# Patient Record
Sex: Female | Born: 1960 | Race: White | Hispanic: No | Marital: Married | Smoking: Never smoker
Health system: Southern US, Community
[De-identification: ages and names within clinical notes are randomized; demographics above are authoritative.]

## PROBLEM LIST (undated history)

## (undated) DIAGNOSIS — F32A Depression, unspecified: Secondary | ICD-10-CM

## (undated) DIAGNOSIS — Z8759 Personal history of other complications of pregnancy, childbirth and the puerperium: Secondary | ICD-10-CM

## (undated) DIAGNOSIS — E785 Hyperlipidemia, unspecified: Secondary | ICD-10-CM

## (undated) DIAGNOSIS — M1990 Unspecified osteoarthritis, unspecified site: Secondary | ICD-10-CM

## (undated) HISTORY — PX: BILATERAL OOPHORECTOMY: SHX1221

## (undated) HISTORY — DX: Unspecified osteoarthritis, unspecified site: M19.90

## (undated) HISTORY — DX: Hyperlipidemia, unspecified: E78.5

## (undated) HISTORY — PX: CHOLECYSTECTOMY: SHX55

## (undated) HISTORY — DX: Depression, unspecified: F32.A

## (undated) HISTORY — PX: COLONOSCOPY: SHX174

## (undated) HISTORY — DX: Personal history of other complications of pregnancy, childbirth and the puerperium: Z87.59

---

## 1999-06-09 ENCOUNTER — Other Ambulatory Visit: Admission: RE | Admit: 1999-06-09 | Discharge: 1999-06-09 | Payer: Self-pay | Admitting: Gynecology

## 1999-10-09 ENCOUNTER — Encounter: Admission: RE | Admit: 1999-10-09 | Discharge: 1999-10-09 | Payer: Self-pay | Admitting: Gynecology

## 1999-10-09 ENCOUNTER — Encounter: Payer: Self-pay | Admitting: Gynecology

## 2000-12-21 ENCOUNTER — Other Ambulatory Visit: Admission: RE | Admit: 2000-12-21 | Discharge: 2000-12-21 | Payer: Self-pay | Admitting: Gynecology

## 2005-10-06 ENCOUNTER — Encounter: Admission: RE | Admit: 2005-10-06 | Discharge: 2005-10-06 | Payer: Self-pay | Admitting: Gynecology

## 2005-11-03 ENCOUNTER — Inpatient Hospital Stay (HOSPITAL_COMMUNITY): Admission: EM | Admit: 2005-11-03 | Discharge: 2005-11-04 | Payer: Self-pay | Admitting: Emergency Medicine

## 2005-11-03 ENCOUNTER — Encounter (INDEPENDENT_AMBULATORY_CARE_PROVIDER_SITE_OTHER): Payer: Self-pay | Admitting: *Deleted

## 2006-08-10 IMAGING — US US ABDOMEN COMPLETE
1 series · 13 of 25 positions shown · non-contrast
Comparison: None.

CLINICAL DATA: Abdominal pain.  Question gallbladder disease.  History of endometriosis.  
 ABDOMEN ULTRASOUND:
TECHNIQUE: Complete abdominal ultrasound examination was performed including evaluation of the liver, gallbladder, bile ducts, pancreas, kidneys, spleen, IVC, and abdominal aorta.

[Series 1: unknown · 0.33mm/px · 13 of 80 slices shown]
[im 1/80]
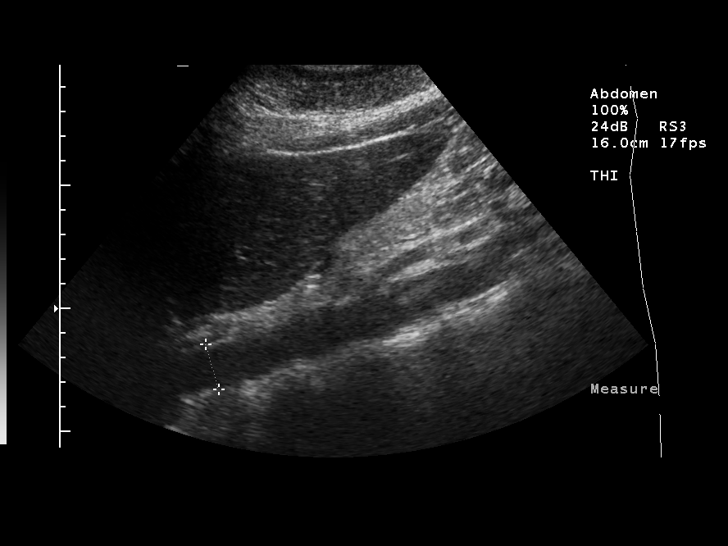
[im 7/80]
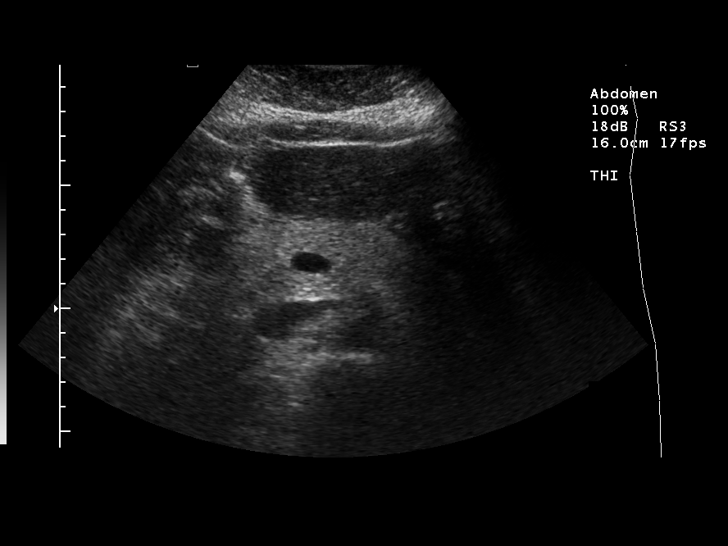
[im 14/80]
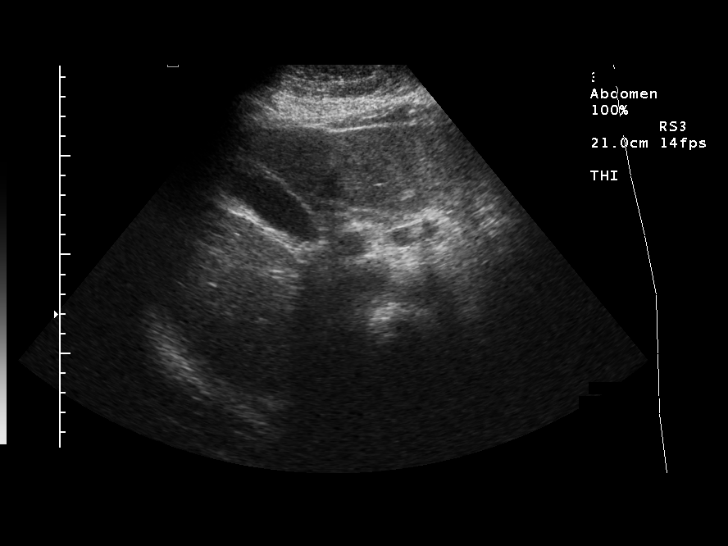
[im 20/80]
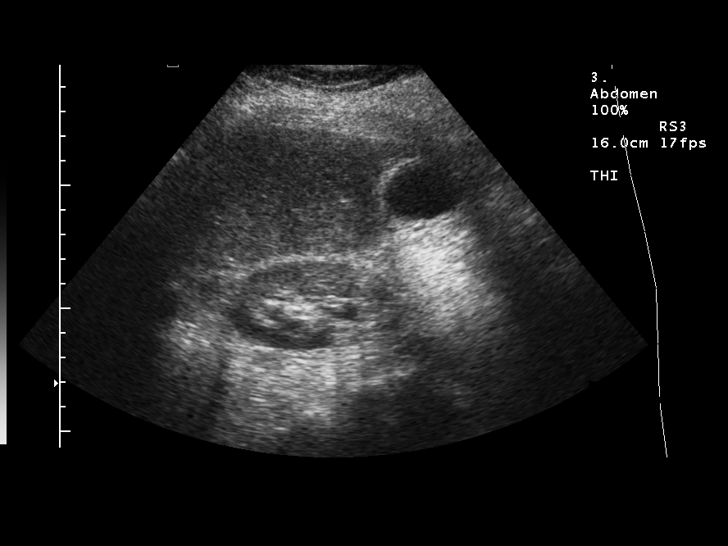
[im 27/80]
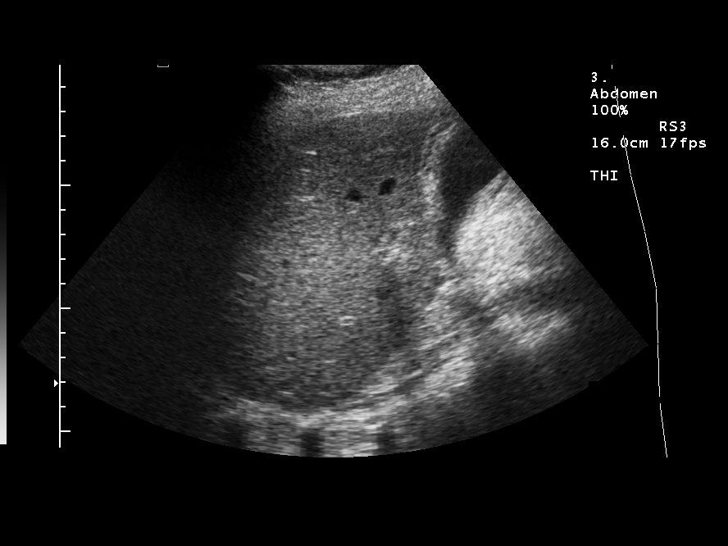
[im 33/80]
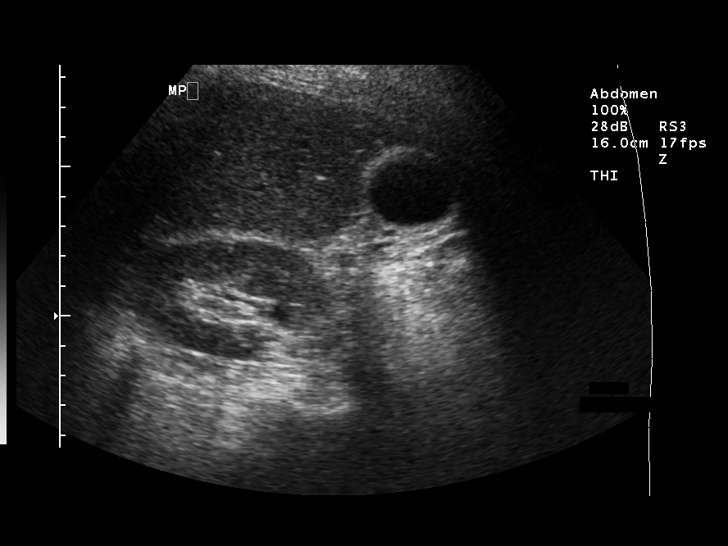
[im 40/80]
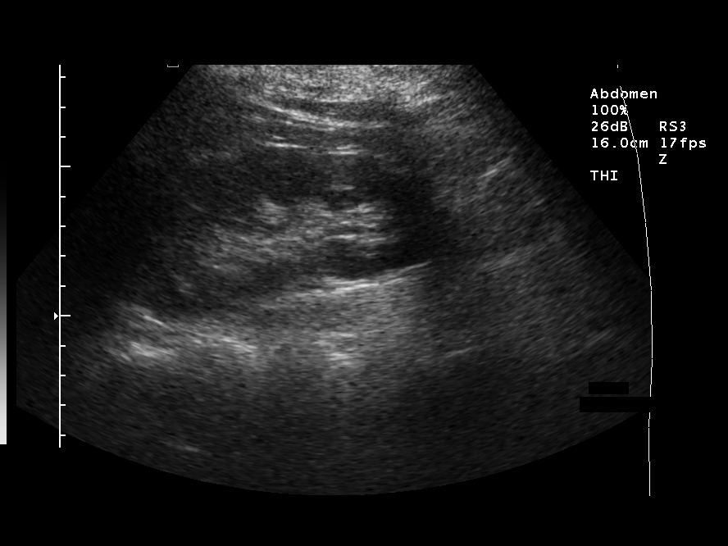
[im 47/80]
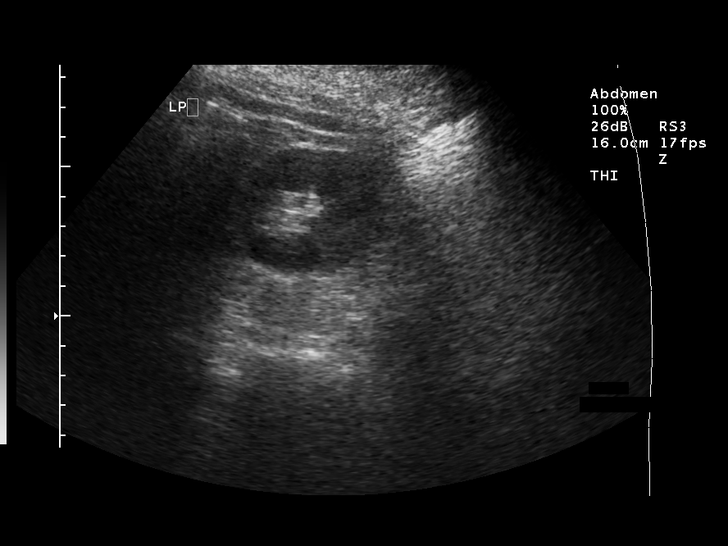
[im 53/80]
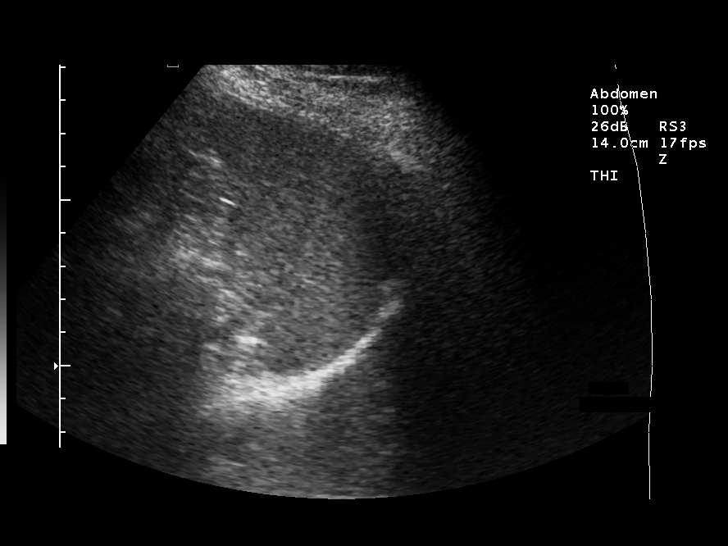
[im 60/80]
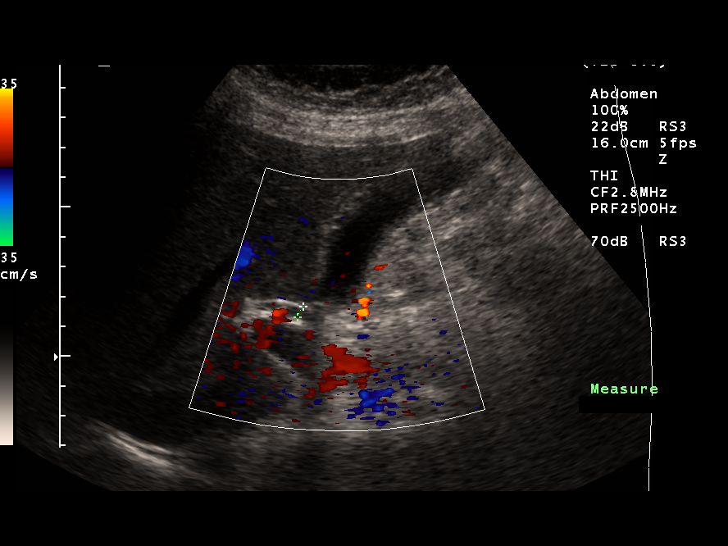
[im 66/80]
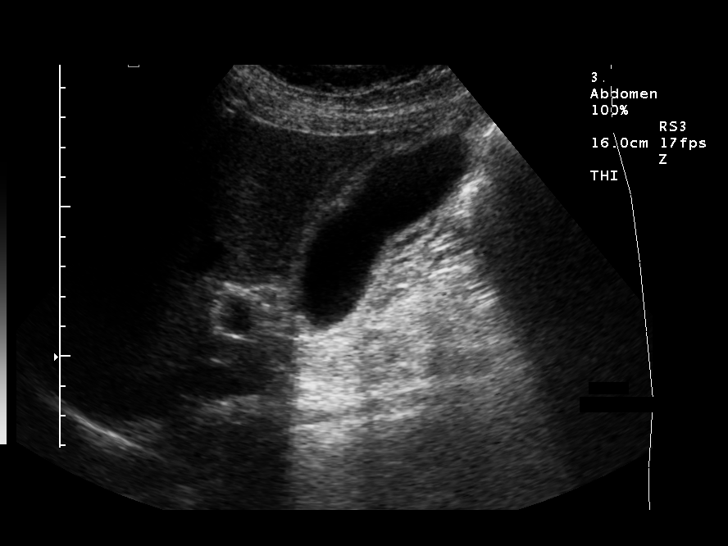
[im 73/80]
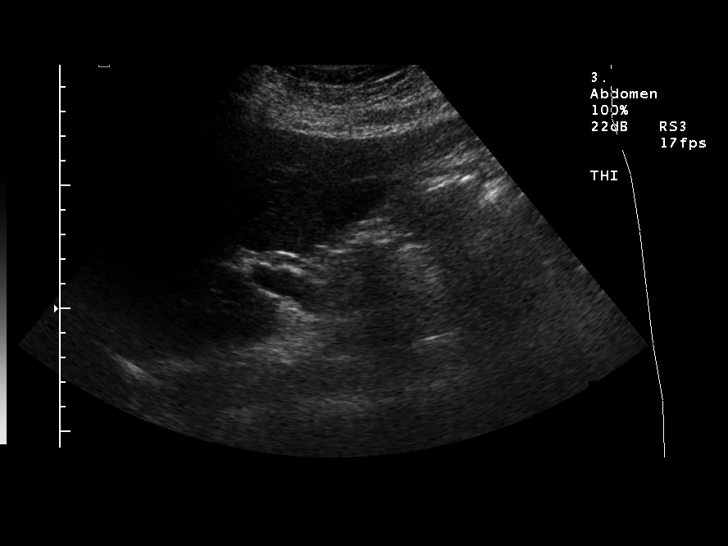
[im 80/80]
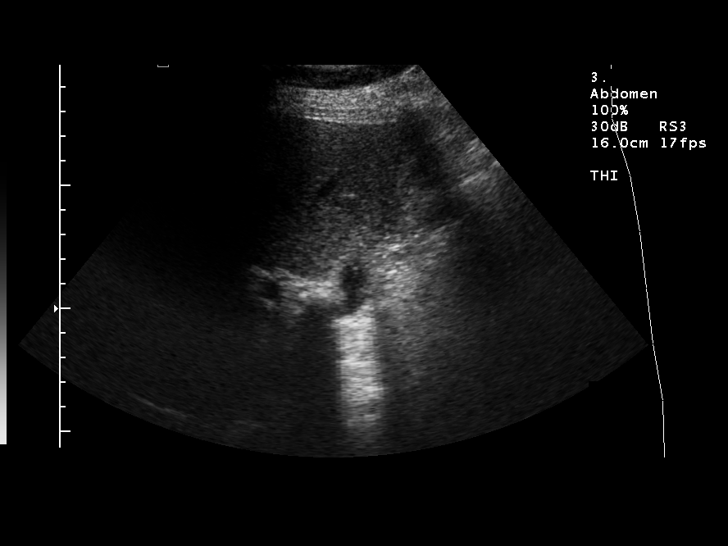

[13 of 25 positions shown; findings below may reference images not displayed]

FINDINGS: There is an echogenic shadowing focus in the gallbladder neck compatible with a compacted stone.  No other gallstones are seen.  There is gallbladder wall thickening to 5 mm.  The patient was not tender on imaging the gallbladder, and no pericholecystic fluid is seen.  However, these findings are suspicious for early cholecystitis. 
 There is no biliary dilatation.  Views of the liver, spleen, inferior vena cava, and abdominal aorta are unremarkable.  Both kidneys appear normal, measuring 11.4 cm in length on the right and 11.5 cm on the left.
IMPRESSION: 1.  Impacted gallstone within the gallbladder neck and gallbladder wall thickening are consistent with early acute cholecystitis.  The patient was not reported to be tender on gallbladder imaging, however.  If further evaluation is warranted clinically, hepatobiliary scan could be performed. 
 2.  Otherwise unremarkable examination.  No biliary dilatation.

## 2006-08-10 IMAGING — CR DG ABDOMEN ACUTE W/ 1V CHEST
3 series · 3 of 3 positions shown · non-contrast
Comparison: none

HISTORY: Epigastric pain, vomiting

ABDOMEN ACUTE WITH PA CHEST:
Normal heart size, mediastinal contours, and pulmonary vascularity.
Lungs clear.
Nonspecific bowel gas pattern.
No signs of bowel obstruction, bowel wall thickening, or perforation.
Bones unremarkable.
No urinary tract calcification.

[w chest pa]
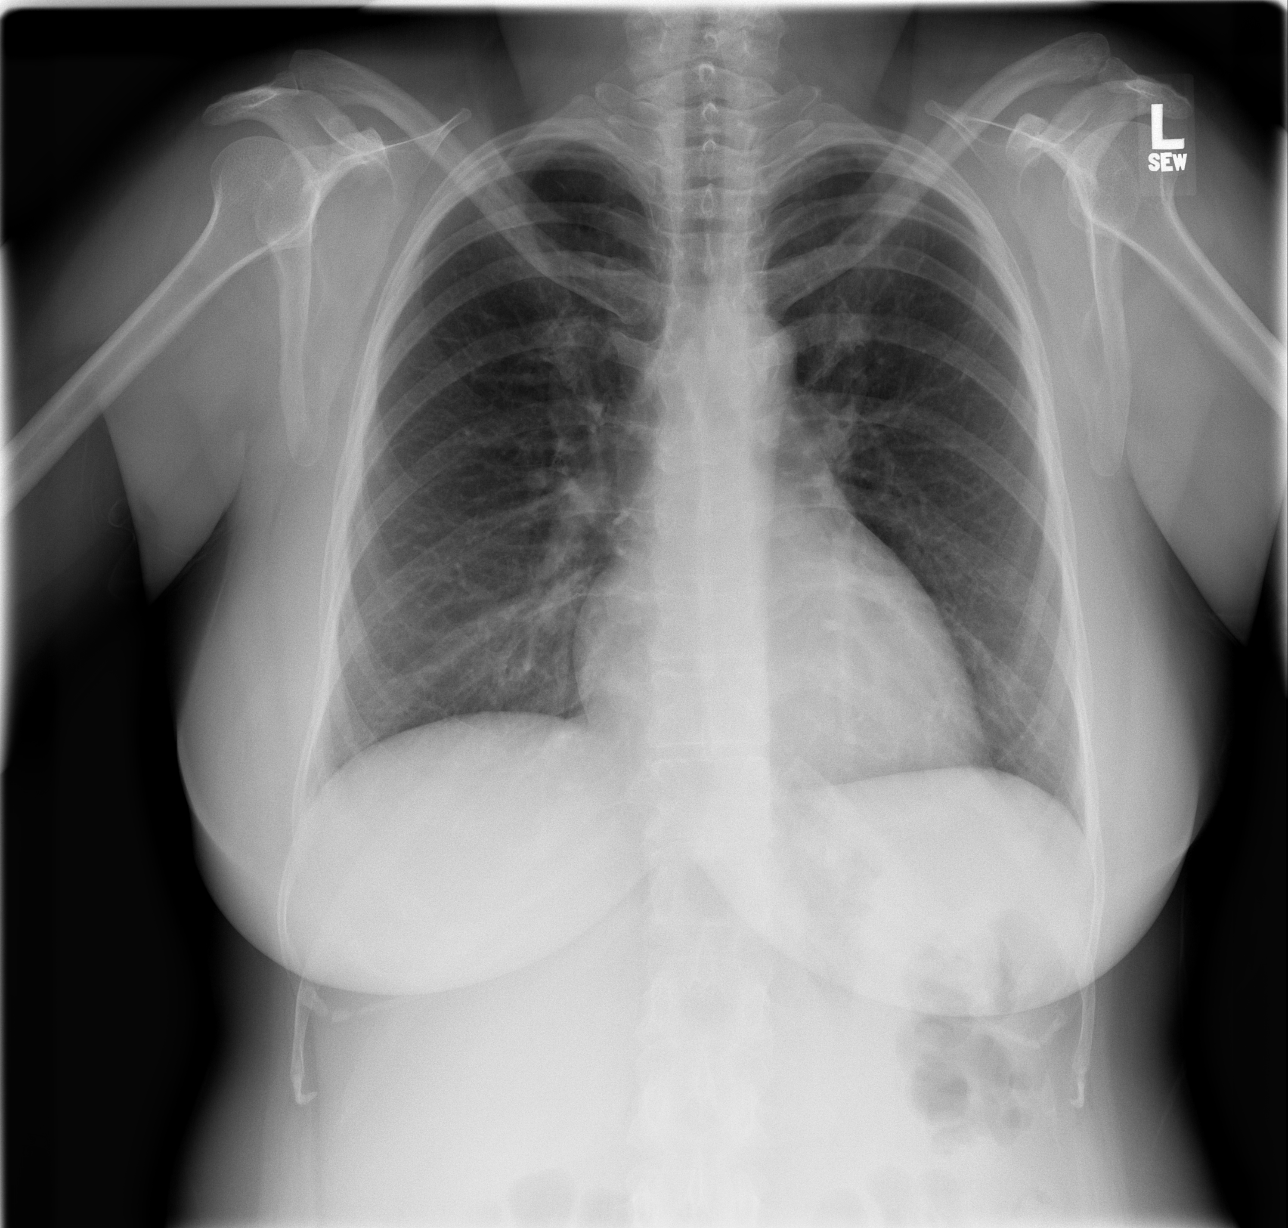

[w abdomen upright]
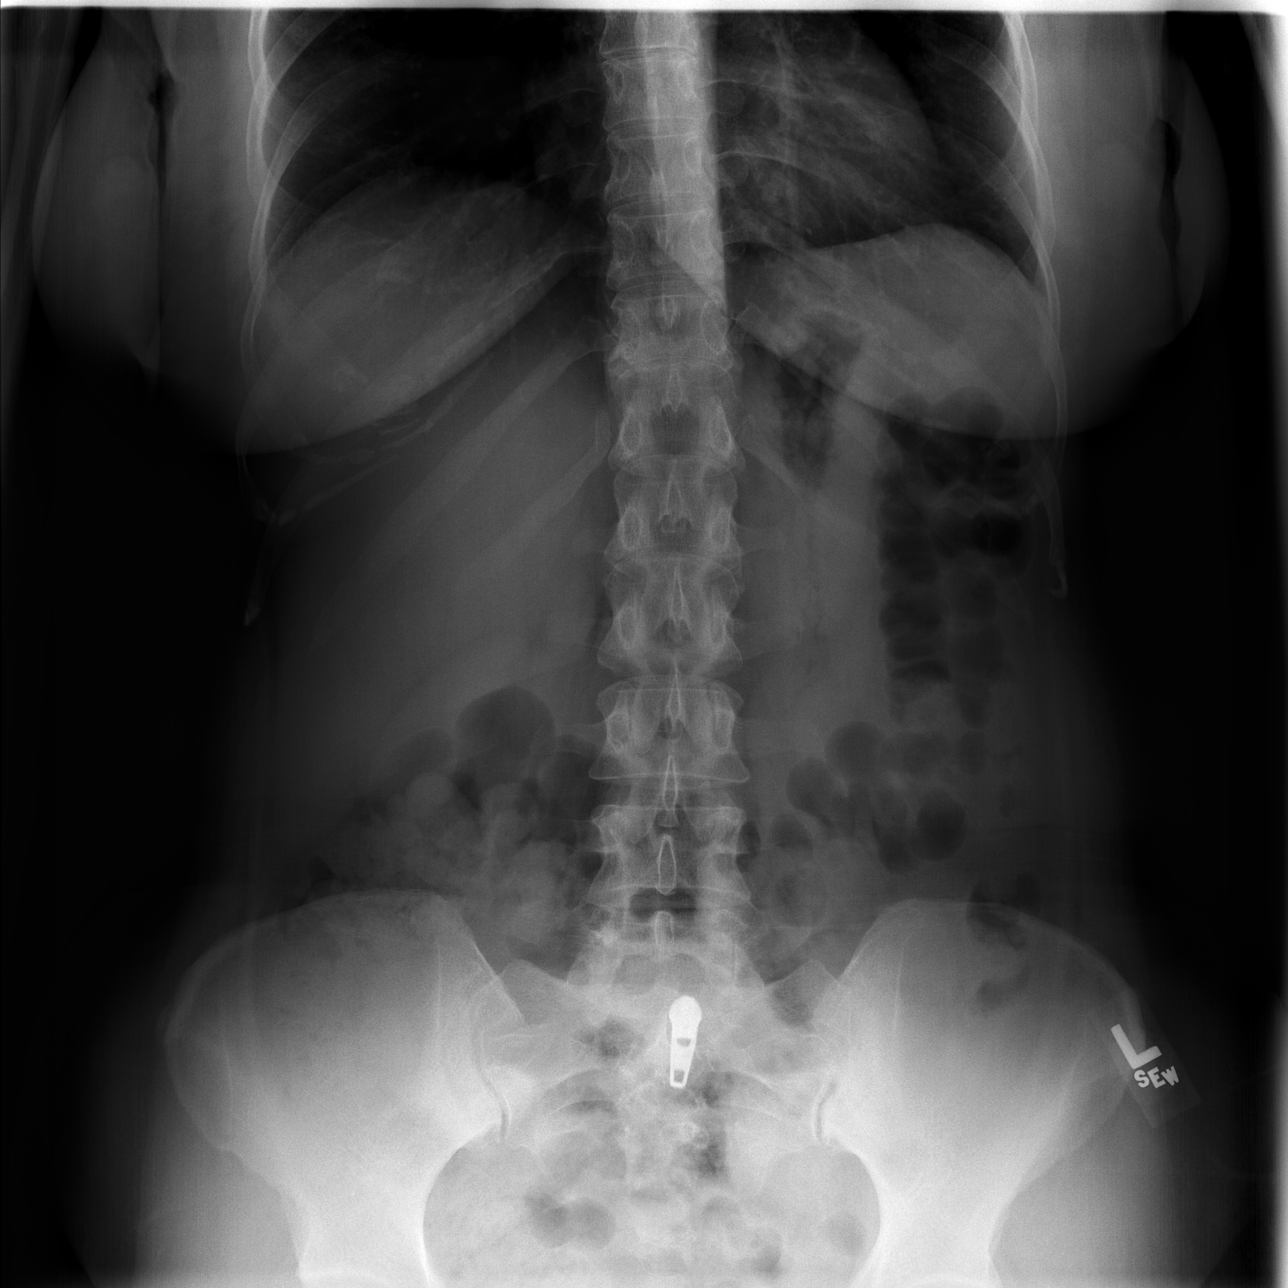

[t abdomen supine]
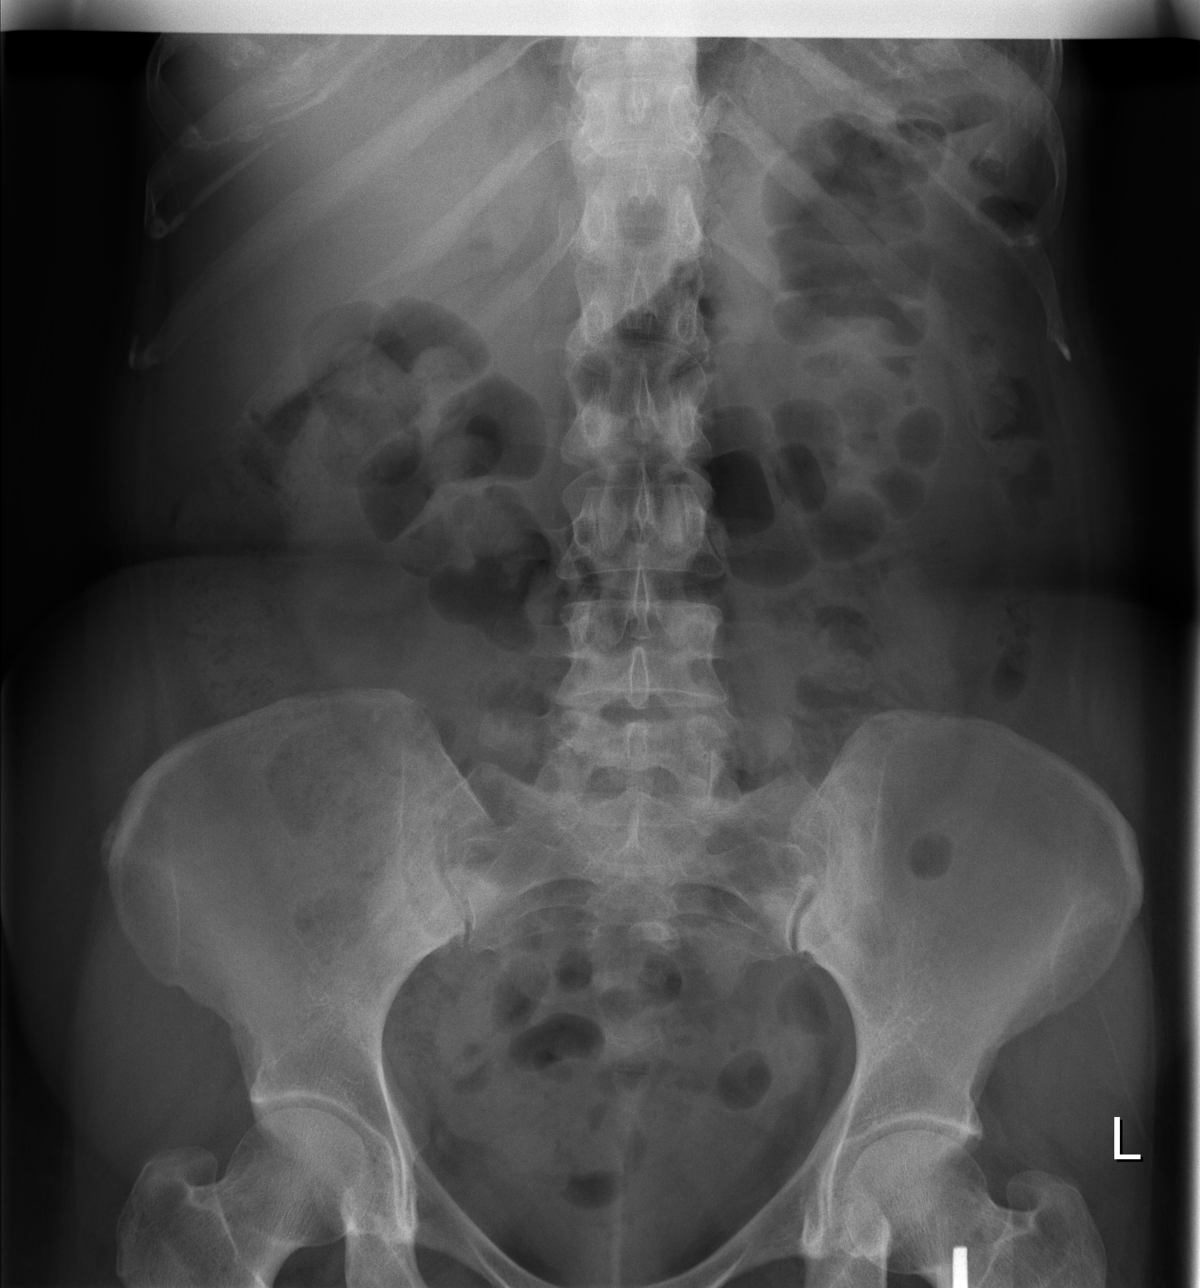

[3 of 3 positions shown; findings below may reference images not displayed]

IMPRESSION: No acute abnormalities.

## 2006-12-01 ENCOUNTER — Other Ambulatory Visit: Admission: RE | Admit: 2006-12-01 | Discharge: 2006-12-01 | Payer: Self-pay | Admitting: Gynecology

## 2006-12-13 ENCOUNTER — Ambulatory Visit: Payer: Self-pay | Admitting: Internal Medicine

## 2006-12-14 ENCOUNTER — Ambulatory Visit: Payer: Self-pay | Admitting: Cardiology

## 2006-12-16 ENCOUNTER — Ambulatory Visit: Payer: Self-pay | Admitting: Internal Medicine

## 2006-12-22 ENCOUNTER — Ambulatory Visit: Payer: Self-pay | Admitting: Internal Medicine

## 2008-08-03 ENCOUNTER — Ambulatory Visit (HOSPITAL_COMMUNITY): Admission: RE | Admit: 2008-08-03 | Discharge: 2008-08-03 | Payer: Self-pay | Admitting: Family Medicine

## 2010-01-19 ENCOUNTER — Emergency Department (HOSPITAL_COMMUNITY): Admission: EM | Admit: 2010-01-19 | Discharge: 2010-01-19 | Payer: Self-pay | Admitting: Emergency Medicine

## 2010-01-19 ENCOUNTER — Encounter (INDEPENDENT_AMBULATORY_CARE_PROVIDER_SITE_OTHER): Payer: Self-pay | Admitting: *Deleted

## 2010-04-15 ENCOUNTER — Other Ambulatory Visit: Admission: RE | Admit: 2010-04-15 | Discharge: 2010-04-15 | Payer: Self-pay | Admitting: Obstetrics and Gynecology

## 2010-04-18 ENCOUNTER — Telehealth: Payer: Self-pay | Admitting: Internal Medicine

## 2010-06-10 ENCOUNTER — Encounter: Payer: Self-pay | Admitting: Internal Medicine

## 2010-09-23 NOTE — Miscellaneous (Signed)
Summary: CHANGE GI  Paitent changing to Dr. Dulce Sellar for GI care.

## 2010-09-23 NOTE — Procedures (Signed)
Summary: Colonoscopy: Normal   Colonoscopy  Procedure date:  12/22/2006  Findings:      Results: Normal. Location:  [facility].   Comments: NORMAL, NO CAUSE OF PROBLEMS SEEN SHE IS VERY SENSITIVE TO SCOPE PASSAGE IN SIGMOID WHICH COULD BE A SIGN OF IRRITABLE BOWEL SYNDROME VS. PROCESS EXTRINSIC TO COLON  Comments:      Colonoscopy around age 50  Procedures Next Due Date:    Colonoscopy: 07/2012  Patient Name: Anita Hawkins, Anita Hawkins MRN: [id number] Procedure Procedures: Colonoscopy CPT: 14782.  Personnel: Endoscopist: Iva Boop, MD, [facility].  Referred By: Teodora Medici, MD.  Exam Location: Exam performed in Outpatient Clinic. Outpatient  Patient Consent: Procedure, Alternatives, Risks and Benefits discussed, consent obtained, from patient. Consent was obtained by the RN.  Indications Symptoms: Abdominal pain / bloating. Change in bowel habits.  History  Current Medications: Patient is not currently taking Coumadin.  Allergies: Allergic to PENICILLINS, SULFA.  Comments: New problems with constipation and LLQ pain. Initial GYN eval apparently unrevealing. CT abd/pelvis with probable left ovarian cyst and small amount of pelvic free fluid. Pre-Exam Physical: Performed Dec 22, 2006. Cardio-pulmonary exam, Rectal exam, HEENT exam , Abdominal exam, Mental status exam WNL.  Comments: Pt. history reviewed/updated, physical exam performed prior to initiation of sedation? YES Exam Exam: Extent of exam reached: Cecum, extent intended: Cecum.  The cecum was identified by appendiceal orifice and IC valve. Patient position: on left side. Time to Cecum: 00:06:14. Time for Withdrawl: 00:06:34. Colon retroflexion performed. Images taken. ASA Classification: I. Tolerance: good.  Monitoring: Pulse and BP monitoring, Oximetry used. Supplemental O2 given.  Colon Prep Used MoviPrep for colon prep. Prep results: excellent.  Sedation Meds: Patient assessed and  found to be appropriate for moderate (conscious) sedation. Fentanyl 75 mcg. given IV. Versed 9 mg. given IV.  Findings - NORMAL EXAM: Cecum to Rectum. Comments: very sensitive to scvope in sigmoid.   Assessment  Comments: NORMAL, NO CAUSE OF PROBLEMS SEEN SHE IS VERY SENSITIVE TO SCOPE PASSAGE IN SIGMOID WHICH COULD BE A SIGN OF IRRITABLE BOWEL SYNDROME VS. PROCESS EXTRINSIC TO COLON Events  Unplanned Interventions: No intervention was required.  Plans Comments: MIRALAX FOR CONSTIPATION LEVSIN SL FOR CRAMPS Disposition: After procedure patient sent to recovery. After recovery patient sent home.  Scheduling/Referral: Colonoscopy, to Iva Boop, MD, [facility], ABOUT AGE 19 , around Aug 07, 2012.   Comments: SEE DR. Chevis Pretty AS PLANNED TO FOLLOW-UP ON POSSIBLE OVARIAN CYST SEEN ON CT  SEE ME IF NEEDED AFTER THAT  CC:   Teodora Medici, MD   Marny Lowenstein, MD  This report was created from the original endoscopy report, which was reviewed and signed by the above listed endoscopist.

## 2010-09-23 NOTE — Progress Notes (Signed)
Summary: TRIAGE  Phone Note Call from Patient Call back at Home Phone [phone]   Caller: Patient Call For: Dr Leone Payor Reason for Call: Talk to Nurse Summary of Call: Patient wants to be seen sooner than first available appt 10-10 for severe left side abd pain. Initial call taken by: Tawni Levy,  April 18, 2010 10:41 AM  Follow-up for Phone Call        Pt. states she has a cysy on right ovary, being treated by her Gyn. She is now having LLQ pain for 1 week. Pain is intermittent, but becomming more constant. Pain is now traveling up her left side and she feels a knot in her side. Also new c/o constipation.  Ibuprofen does help the pain. Gyno. recommends she see her GI MD.   1) See Dr.Gessner on 04-22-10 at 9:30am. (Pt. will have Gyno. records faxed to [facility] at [phone]) 2) Continue Ibuprofen as needed 3) If symptoms become worse call back immediately or go to ER.  Follow-up by: Laureen Ochs LPN,  April 18, 2010 11:29 AM  Additional Follow-up for Phone Call Additional follow up Details #1::        ok Iva Boop MD, [facility]  April 18, 2010 1:25 PM

## 2010-11-10 LAB — URINALYSIS, ROUTINE W REFLEX MICROSCOPIC
Bilirubin Urine: NEGATIVE
Glucose, UA: NEGATIVE mg/dL
Ketones, ur: NEGATIVE mg/dL
Leukocytes, UA: NEGATIVE
Protein, ur: NEGATIVE mg/dL

## 2010-11-10 LAB — CBC
Hemoglobin: 12.3 g/dL (ref 12.0–15.0)
MCHC: 35 g/dL (ref 30.0–36.0)
RBC: 4.06 MIL/uL (ref 3.87–5.11)
RDW: 13.2 % (ref 11.5–15.5)
WBC: 12.4 10*3/uL — ABNORMAL HIGH (ref 4.0–10.5)

## 2010-11-10 LAB — COMPREHENSIVE METABOLIC PANEL
ALT: 20 U/L (ref 0–35)
Alkaline Phosphatase: 64 U/L (ref 39–117)
BUN: 5 mg/dL — ABNORMAL LOW (ref 6–23)
Creatinine, Ser: 0.33 mg/dL — ABNORMAL LOW (ref 0.4–1.2)
GFR calc non Af Amer: >60 mL/min (ref >60)
Potassium: 3.6 mEq/L (ref 3.5–5.1)
Total Bilirubin: 0.5 mg/dL (ref 0.3–1.2)

## 2010-11-10 LAB — DIFFERENTIAL
Basophils Relative: 1 % (ref 0–1)
Eosinophils Absolute: 0.2 10*3/uL (ref 0.0–0.7)
Eosinophils Relative: 2 % (ref 0–5)
Lymphocytes Relative: 37 % (ref 12–46)
Neutrophils Relative %: 54 % (ref 43–77)

## 2010-11-10 LAB — URINE MICROSCOPIC-ADD ON

## 2011-01-09 NOTE — Op Note (Signed)
Anita Hawkins, Anita Hawkins           ACCOUNT NO.:  [id number]   MEDICAL RECORD NO.:  [id number]          Anita Hawkins TYPE:  INP   LOCATION:  2550                         FACILITY:  [facility]   PHYSICIAN:  Maisie Fus A. Cornett, M.D.DATE OF BIRTH:  20-Oct-1960   DATE OF PROCEDURE:  11/03/2005  DATE OF DISCHARGE:                                 OPERATIVE REPORT   PREOP DIAGNOSIS:  Acute cholecystitis.   POSTOP DIAGNOSIS:  Acute cholecystitis.   PROCEDURE:  Laparoscopic cholecystectomy with intraoperative cholangiogram.   SURGEON:  Dr. Harriette Bouillon.   ANESTHESIA:  General endotracheal anesthesia with 10 mL of 0.25% Sensorcaine  with epinephrine.   ESTIMATED BLOOD LOSS:  30 mL.   DRAINS:  None.   SPECIMEN:  Gallbladder with gallstones with acute changes to pathology.   INDICATIONS FOR PROCEDURE:  Anita Hawkins is a 50 year old female who has  presented with right upper quadrant pain this morning. Ultrasound revealed  thickened gallbladder with gallstones. On examination she had point  tenderness and leukocytosis of her white cell count today. She had findings  consistent with acute cholecystitis. I discussed findings with Anita Hawkins  her husband and recommend laparoscopic cholecystectomy with intraoperative  cholangiogram. There are no other alternative therapies for this that are  effective and I discussed that as well with them. After discussing Anita  operation to them Anita risks as well as long-term outcome of this as well as  any potential complications, they voiced understanding and were eager to  proceed.   DESCRIPTION OF PROCEDURE:  Anita Hawkins brought to Anita operating room, placed  supine. After induction of general endotracheal anesthesia Anita abdomen was  prepped and draped in sterile fashion. A 1 cm incision was made just below  her umbilicus. Dissection was carried down to her fascia.  Her fascia was  grasped with Kocher.  A small incision was made in Anita fascia and both edges  were grabbed with Kocher clamps. I used a hemostat to push through Anita  peritoneal lining into Anita abdominal cavity. I used my finger to sweep  around felt no adhesions. Pursestring suture of 0 Vicryl was then placed and  a 12 mm Hasson cannula was placed under direct vision. Pneumoperitoneum was  created to 15 mmHg CO2. Laparoscope was then placed. Laparoscopy was  performed. No evidence of solid or hollow organ injury insertion of Anita  Hasson. Next a 5 mm port was placed in subxiphoid position.  Two other 5-mm  ports were placed in Anita right mid abdomen under direct vision. Anita  gallbladder identified and signs of acute cholecystitis. Anita dome was  grasped and retracted Anita Hawkins's right shoulder. A second grasper was  used to grab Anita infundibulum. Prior to doing this Anita omentum was stuck to  Anita surface Anita liver.  I took this down with cautery. I then grabbed Anita  infundibulum and retracted it toward Anita Hawkins's right lower quadrant. I  then used electrocautery to score Anita peritoneum and stripped this down from  Anita junction of Anita cystic duct to Anita infundibulum and dissected out Anita  cystic duct circumferentially. After this was done, a  clip was placed on Anita  gallbladder side of Anita cystic duct. Small incision was made using Endo  shears on Anita cystic duct for cholangiogram. Through a separate stab  incision a Cook cholangiogram catheter was introduced and this was placed in  Anita cystic duct and held in place with a clip. Intraoperative cholangiogram  performed and showed filling of Anita cystic duct with a very low insertion of  Anita cystic duct into Anita common bile duct. Anita bifurcation was well  visualized with contrast and there is free flow of contrast down Anita common  duct into Anita duodenum. No signs of stone or stricture. This point, Anita  cystic duct was clipped and divided. Anita cystic arteries identified,  dissected out double clipped and divided as well. There was small  posterior  branch Anita cystic artery was controlled with a clip as well. Cautery used to  dissect Anita gallbladder away from Anita gallbladder fossa. There was  significant acute inflammatory changes. At this point Anita 5 mm scope was  used for visualization and Anita gallbladder was then placed an EndoCatch bag  extracted Anita umbilicus after removing it from Anita gallbladder bed. Cautery  is used to cauterize Anita gallbladder bed with good hemostasis. I then  inspected Anita internal organs which included Anita hollow and solid viscus and  saw no signs of injury to those. At this point, all irrigation was suctioned  out. Anita ports were subsequently removed with no signs of port site bleeding  and passed off Anita field. At this point, CO2 was released Anita umbilical port  was closed with Anita pursestring Vicryl suture. 4-0 Monocryl used to close  Anita skin incisions. Steri-Strips and dry dressings were applied. All final  counts of sponge, needle and instruments were counted and found to be  correct at this portion of Anita case. Anita Hawkins was then awoke taken to  recovery in satisfactory condition.      Thomas A. Cornett, M.D.  Electronically Signed     TAC/MEDQ  D:  11/03/2005  T:  11/04/2005  Job:  [id number]

## 2011-01-09 NOTE — Assessment & Plan Note (Signed)
[facility]                         GASTROENTEROLOGY OFFICE NOTE   Anita Hawkins, Anita Hawkins                    MRN:          [id number]  DATE:12/13/2006                            DOB:          09/25/1960    REFERRING PHYSICIAN:  Leatha Gilding. Mezer, M.D.   REASON FOR CONSULTATION:  Question blockage in intestines.   ASSESSMENT:  A 50 year old white woman who has had about a two-week  history of difficulty with defecation and left lower quadrant pain.  She  is tender on exam today, with some guarding, and I am suspicious of  diverticulitis.   PLAN:  1. Go ahead and try to take several doses of MiraLax (she has been on      this once a day for the past 5-6 days) tonight.  2. CT of the abdomen and pelvis with IV and oral contrast, looking for      the possibility of diverticulitis or other causes of this abdominal      pain.  3. Pending that, a colonoscopy will be considered and likely      recommended.   HISTORY:  About two weeks or so ago, she developed left lower quadrant  and left midabdominal pain coming and going, with difficulty in  defecation.  The stools are hard, and she is not producing much of a  bowel movement.  She thought maybe she had an ovarian cyst, and she had  an ultrasound from Dr. Chevis Pretty that apparently showed stuff lying in her  colon.  She had a CBC which was normal on December 01, 2006.  HCG was  negative.  TSH normal.  A prolactin was normal.  She had the ultrasound  and did not apparently have an ovarian cyst.  Since that time, she has  been on some MiraLax but not having good bowel movements yet.  She does  not describe bleeding.  There is a history of postcholecystectomy  diarrhea a year ago, and that did resolve.   PAST MEDICAL HISTORY:  1. Endometriosis.  2. Dyslipidemia.  3. Asthma.  4. Cholecystectomy, November 04, 2006.   MEDICATIONS:  1. MiraLax daily.  2. Multivitamin daily.  3. Albuterol inhaler p.r.n.   DRUG  ALLERGIES:  1. PENICILLIN.  2. AMOXICILLIN.  3. SULFA.   FAMILY HISTORY:  Noncontributory.  See our review.   SOCIAL HISTORY:  She is married.  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  She is here with her husband, who has  been a patient of Dr. Corinda Gubler.  No alcohol, tobacco, or drugs.   REVIEW OF SYSTEMS:  She is somewhat short of breath at times, otherwise  negative or as above.   PHYSICAL EXAMINATION:  GENERAL:  Well-developed white woman, no acute  distress.  VITAL SIGNS:  Height 5 feet 2 inches, weight 147 pounds, blood pressure  96/64, pulse 72.  HEENT:  Eyes anicteric.  ENT:  Normal mouth, nose, pharynx.  NECK:  Supple.  No masses.  CHEST:  Clear.  HEART:  S1, S2.  No murmurs, rubs, or gallops.  ABDOMEN:  Tender in the left  lower quadrant and left mid- and upper  quadrant somewhat, with some mild guarding to moderate guarding.  RECTAL:  Deferred.  LYMPHATIC:  No supraclavicular, cervical or groin nodes .  EXTREMITIES:  No edema.  SKIN:  No rash.  NEUROLOGIC:  She is alert and oriented x3.   I appreciate the opportunity to care for this patient.     Iva Boop, MD,FACG  Electronically Signed    CEG/MedQ  DD: 12/13/2006  DT: 12/14/2006  Job #: [id number]   cc:   Leatha Gilding. Mezer, M.D.  Jethro Bastos, M.D.

## 2011-01-09 NOTE — H&P (Signed)
NAMEABBIE, Anita Hawkins NO.:  [id number]   MEDICAL RECORD NO.:  [id number]          PATIENT TYPE:   LOCATION:                                 FACILITY:   PHYSICIAN:  Thomas A. Cornett, M.D.DATE OF BIRTH:  02/08/1961   DATE OF ADMISSION:  11/03/2005  DATE OF DISCHARGE:                                HISTORY & PHYSICAL   CHIEF COMPLAINT:  Abdominal pain with ultrasound findings consistent with  acute cholecystectomy.   HISTORY OF PRESENT ILLNESS:  Anita Hawkins is a 50 year old female patient  who yesterday awakened at 3 a.m. with severe nausea. She drank some ginger  ale and felt better.  She did fine throughout the remainder of the day.  She  was cooking supper yesterday evening when the smell of food induced severe  nausea with subsequent intractable nausea and vomiting.  Thereafter, she  developed a claw-like constant epigastric pain.  The pain was so severe the  patient was unable to obtain relief.  She tried to walk.  She tried to sit  down. Nothing helped. She took over-the-counter Prevacid and vomited this.  She presented to the ER where her LFTs were normal.  Her white count was  15,000.  She was afebrile, but ultrasound did show an impacted gallstone  with evidence of acute cholecystitis.  Therefore, surgery has been consulted  for surgical evaluation.   REVIEW OF SYSTEMS:  As per History of Present Illness.  No fevers, chills,  myalgias.  No cough.  No shortness of breath.  No hematemesis, melena, or  hematochezia.  No dysuria.   PAST MEDICAL HISTORY:  Asthma and endometriosis.   PAST SURGICAL HISTORY:  Exploratory laparoscopic evaluation with subsequent  lysis of left ovarian area endometriosis tissue.   FAMILY MEDICAL HISTORY:  Mother: Diabetes.   SOCIAL HISTORY:  She does not smoke.  She does not drink alcohol.  She is  married.  She works in Agricultural consultant at [employer].   ALLERGIES:  PENICILLIN, SULFA, and AMOXICILLIN which cause rash,  tachycardia, and itching.   MEDICATIONS:  1.  Advair p.r.n.  2.  Albuterol rescue inhaler.   PHYSICAL EXAMINATION:  GENERAL:  Pleasant female.  Currently denies resting  abdominal pain.  States she is hungry.  VITAL SIGNS: Temperature 98.5, blood pressure 106/52, respirations 20, pulse  86 and regular.  NECK:  Supple. No adenopathy.  CHEST: Bilateral lung sounds are clear to auscultation.  She is on room air.  CARDIAC: S1 and S2.  No rubs, murmurs, thrills, gallops.  No JVD.  ABDOMEN: Soft.  Bowel sounds are present. There is tenderness in the  epigastric region with palpation.  There is involuntary guarding without  rebound.  EXTREMITIES: No edema, cyanosis, clubbing.  Pulses are palpable.  NEUROLOGIC: The patient is alert and oriented x3, moving all extremities x4.  No focal deficits.   LABORATORY DATA:  White count 15,000, hemoglobin 11.8, platelets 362,000.  Sodium 136, potassium 3.8, CO2 27, BUN 4, creatinine 0.5, glucose 154.  Urinalysis is negative.  LFTs are normal.   EKG shows no acute changes, sinus rhythm.   Ultrasound shows an  impacted gallstone in neck of gallbladder, otherwise  findings are consistent with acute cholecystitis. No biliary dilatation in  the ducts.   IMPRESSION:  1.  Acute cholecystitis.  2.  Leukocytosis.   PLAN:  1.  Admit patient.  2.  Proceed with operative intervention, laparoscopic cholecystectomy today.      Risks and benefits of this procedure have been discussed with the      patient by Dr. Luisa Hart including possibility patient may require open      procedure.  3.  Initiate Cipro IV.  4.  Change IV fluids to D5 normal saline 150 an hour.  5.  Follow postoperatively.      Anita Hawkins, N.P.      Thomas A. Cornett, M.D.  Electronically Signed    ALE/MEDQ  D:  11/03/2005  T:  11/03/2005  Job:  [id number]

## 2011-01-09 NOTE — Discharge Summary (Signed)
Anita Hawkins, RASHEED NO.:  [id number]   MEDICAL RECORD NO.:  [id number]          PATIENT TYPE:  INP   LOCATION:  5703                         FACILITY:  [facility]   PHYSICIAN:  Maisie Fus A. Cornett, M.D.DATE OF BIRTH:  [birth year]   DATE OF ADMISSION:  11/02/2005  DATE OF DISCHARGE:  11/04/2005                                 DISCHARGE SUMMARY   CHIEF COMPLAINT/REASON FOR ADMISSION:  A 50 year old female patient, only  past medical history significant for asthma and endometriosis, awakened at 3  in the morning with significant abdominal pain, presented to the ER.  She  was found to have a white count of 15,000.  Ultrasound demonstrated infected  gallstones and evidence of acute cholecystitis.  On initial exam, the  patient was afebrile, and vital signs were stable.  Her abdomen was tender  in the epigastric region with palpation and voluntary guarding without  rebounding.   The patient was admitted with a diagnosis of acute cholecystitis and  leukocytosis.   HOSPITAL COURSE:  The patient was taken to the operating room on the day of  admission where she underwent a laparoscopic cholecystectomy with  intraoperative cholangiogram.  She was sent to the floor to recover.  She  was stable in the postoperative period.  She had mild transaminitis postop  day 1, AST 60, ALT 68.  Total bilirubin was normal.  She was tolerating a  diet.  She was tender at the umbilical incision, but incisions were clean,  dry, and intact.  The patient was deemed appropriate for discharge home.  She was sent home with Percocet and with orders to follow up with Dr.  Luisa Hart in 1-2 weeks.   FINAL DISCHARGE DIAGNOSES:  1.  Acute cholecystitis, status post laparoscopic cholecystectomy.  2.  History of asthma.  3.  History of endometriosis.   DISCHARGE MEDICATIONS:  1.  Resume prior home medications.  2.  Percocet 5/325, 1-2 q.4h. p.r.n. for pain.   DIET:  No restrictions.   ACTIVITY:  No  driving for 2 weeks, no lifting more than 5 pounds for 2  weeks.  Return to work no sooner than 2 weeks or once Dr. Luisa Hart releases  you.   FOLLOW UP:  She needs to call Dr. Luisa Hart for an appointment to be seen in 2  weeks.   OTHER INSTRUCTIONS:  She is to call for oral temperatures greater than  100.5; if she develops any redness, drainage, or swelling of the incisions.      Allison L. Rennis Harding, N.P.      Thomas A. Cornett, M.D.  Electronically Signed    ALE/MEDQ  D:  12/11/2005  T:  12/12/2005  Job:  [id number]

## 2012-07-15 ENCOUNTER — Other Ambulatory Visit (HOSPITAL_COMMUNITY)
Admission: RE | Admit: 2012-07-15 | Discharge: 2012-07-15 | Disposition: A | Discharge status: Home / Self Care | Payer: BC Managed Care – PPO | Source: Ambulatory Visit | Attending: Family Medicine | Admitting: Family Medicine

## 2012-07-15 ENCOUNTER — Other Ambulatory Visit: Payer: Self-pay | Admitting: Family Medicine

## 2012-07-15 DIAGNOSIS — Z124 Encounter for screening for malignant neoplasm of cervix: Secondary | ICD-10-CM | POA: Insufficient documentation

## 2012-07-15 DIAGNOSIS — Z1151 Encounter for screening for human papillomavirus (HPV): Secondary | ICD-10-CM | POA: Insufficient documentation

## 2013-05-18 ENCOUNTER — Ambulatory Visit (HOSPITAL_COMMUNITY): Payer: BC Managed Care – PPO | Admitting: Psychiatry

## 2013-10-24 ENCOUNTER — Other Ambulatory Visit: Payer: Self-pay

## 2015-12-13 ENCOUNTER — Other Ambulatory Visit: Payer: Self-pay | Admitting: Family Medicine

## 2015-12-13 ENCOUNTER — Other Ambulatory Visit (HOSPITAL_COMMUNITY)
Admission: RE | Admit: 2015-12-13 | Discharge: 2015-12-13 | Disposition: A | Discharge status: Home / Self Care | Payer: BLUE CROSS/BLUE SHIELD | Source: Ambulatory Visit | Attending: Family Medicine | Admitting: Family Medicine

## 2015-12-13 DIAGNOSIS — Z01411 Encounter for gynecological examination (general) (routine) with abnormal findings: Secondary | ICD-10-CM | POA: Insufficient documentation

## 2015-12-17 LAB — CYTOLOGY - PAP

## 2016-03-02 ENCOUNTER — Other Ambulatory Visit: Payer: Self-pay | Admitting: Family Medicine

## 2016-03-02 DIAGNOSIS — Z1231 Encounter for screening mammogram for malignant neoplasm of breast: Secondary | ICD-10-CM

## 2016-03-24 ENCOUNTER — Ambulatory Visit
Admission: RE | Admit: 2016-03-24 | Discharge: 2016-03-24 | Disposition: A | Discharge status: Home / Self Care | Payer: BLUE CROSS/BLUE SHIELD | Source: Ambulatory Visit | Attending: Family Medicine | Admitting: Family Medicine

## 2016-03-24 ENCOUNTER — Encounter: Payer: Self-pay | Admitting: Radiology

## 2016-03-24 DIAGNOSIS — Z1231 Encounter for screening mammogram for malignant neoplasm of breast: Secondary | ICD-10-CM

## 2018-03-22 ENCOUNTER — Telehealth (HOSPITAL_COMMUNITY): Payer: Self-pay | Admitting: Family Medicine

## 2018-03-24 ENCOUNTER — Other Ambulatory Visit (HOSPITAL_COMMUNITY): Payer: Self-pay | Admitting: Family Medicine

## 2018-03-24 DIAGNOSIS — I340 Nonrheumatic mitral (valve) insufficiency: Secondary | ICD-10-CM

## 2018-03-24 NOTE — Telephone Encounter (Signed)
User: Trina AoGRIFFIN, Nicandro Perrault A Date/time: 03/24/18 10:50 AM  Comment: Called pt and lmsg for her to CB to get sch for an echo.[email or web address] [email or web address]  Context:  Outcome: Left Message  Phone number: [phone] Phone Type: Mobile  Comm. type: Telephone Call type: Outgoing  Contact: Wallace Kellerhornton, Brynlei L Relation to patient: Self    User: Trina AoGRIFFIN, Lema Heinkel A Date/time: 03/23/18 3:16 PM  Comment: Called pt and lmsg for him to CB to get sch for an echo.[email or web address] [email or web address]  Context:  Outcome: Left Message  Phone number: [phone] Phone Type: Home Phone  Comm. type: Telephone Call type: Outgoing  Contact: Wallace Kellerhornton, Velmer L Relation to patient: Self    User: Trina AoGRIFFIN, Raniah Karan A Date/time: 03/22/18 3:33 PM  Comment: Called pt and lmsg for her to CB to get sch for an echo.[email or web address] [email or web address]  Context:  Outcome: Left Message  Phone number: [phone] Phone Type: Mobile  Comm. type: Telephone Call type: Outgoing  Contact: Wallace Kellerhornton, Niya L Relation to patient: Self

## 2018-03-30 ENCOUNTER — Other Ambulatory Visit: Payer: Self-pay

## 2018-03-30 ENCOUNTER — Encounter (INDEPENDENT_AMBULATORY_CARE_PROVIDER_SITE_OTHER): Payer: Self-pay

## 2018-03-30 ENCOUNTER — Ambulatory Visit (HOSPITAL_COMMUNITY): Payer: BLUE CROSS/BLUE SHIELD | Attending: Cardiology

## 2018-03-30 DIAGNOSIS — E785 Hyperlipidemia, unspecified: Secondary | ICD-10-CM | POA: Insufficient documentation

## 2018-03-30 DIAGNOSIS — I080 Rheumatic disorders of both mitral and aortic valves: Secondary | ICD-10-CM | POA: Diagnosis not present

## 2018-03-30 DIAGNOSIS — I340 Nonrheumatic mitral (valve) insufficiency: Secondary | ICD-10-CM

## 2018-06-24 ENCOUNTER — Other Ambulatory Visit: Payer: Self-pay | Admitting: Family Medicine

## 2018-06-24 DIAGNOSIS — Z1231 Encounter for screening mammogram for malignant neoplasm of breast: Secondary | ICD-10-CM

## 2018-08-03 ENCOUNTER — Ambulatory Visit
Admission: RE | Admit: 2018-08-03 | Discharge: 2018-08-03 | Disposition: A | Discharge status: Home / Self Care | Payer: BLUE CROSS/BLUE SHIELD | Source: Ambulatory Visit | Attending: Family Medicine | Admitting: Family Medicine

## 2018-08-03 DIAGNOSIS — Z1231 Encounter for screening mammogram for malignant neoplasm of breast: Secondary | ICD-10-CM

## 2020-09-06 DIAGNOSIS — F339 Major depressive disorder, recurrent, unspecified: Secondary | ICD-10-CM | POA: Diagnosis not present

## 2020-09-06 DIAGNOSIS — E785 Hyperlipidemia, unspecified: Secondary | ICD-10-CM | POA: Diagnosis not present

## 2020-09-06 DIAGNOSIS — Z79899 Other long term (current) drug therapy: Secondary | ICD-10-CM | POA: Diagnosis not present

## 2020-09-06 DIAGNOSIS — Z0000 Encounter for general adult medical examination without abnormal findings: Secondary | ICD-10-CM | POA: Diagnosis not present

## 2020-09-06 DIAGNOSIS — R2689 Other abnormalities of gait and mobility: Secondary | ICD-10-CM | POA: Diagnosis not present

## 2020-09-06 DIAGNOSIS — I340 Nonrheumatic mitral (valve) insufficiency: Secondary | ICD-10-CM | POA: Diagnosis not present

## 2020-09-06 DIAGNOSIS — E559 Vitamin D deficiency, unspecified: Secondary | ICD-10-CM | POA: Diagnosis not present

## 2020-09-06 DIAGNOSIS — M1990 Unspecified osteoarthritis, unspecified site: Secondary | ICD-10-CM | POA: Diagnosis not present

## 2020-12-27 DIAGNOSIS — R519 Headache, unspecified: Secondary | ICD-10-CM | POA: Diagnosis not present

## 2020-12-27 DIAGNOSIS — N644 Mastodynia: Secondary | ICD-10-CM | POA: Diagnosis not present

## 2020-12-27 DIAGNOSIS — Z803 Family history of malignant neoplasm of breast: Secondary | ICD-10-CM | POA: Diagnosis not present

## 2020-12-27 DIAGNOSIS — R2689 Other abnormalities of gait and mobility: Secondary | ICD-10-CM | POA: Diagnosis not present

## 2020-12-31 ENCOUNTER — Encounter: Payer: Self-pay | Admitting: Neurology

## 2021-01-03 ENCOUNTER — Other Ambulatory Visit: Payer: Self-pay | Admitting: Family Medicine

## 2021-01-03 DIAGNOSIS — N644 Mastodynia: Secondary | ICD-10-CM

## 2021-02-06 ENCOUNTER — Other Ambulatory Visit: Payer: Self-pay

## 2021-02-06 ENCOUNTER — Ambulatory Visit
Admission: RE | Admit: 2021-02-06 | Discharge: 2021-02-06 | Disposition: A | Discharge status: Home / Self Care | Payer: BLUE CROSS/BLUE SHIELD | Source: Ambulatory Visit | Attending: Family Medicine | Admitting: Family Medicine

## 2021-02-06 ENCOUNTER — Ambulatory Visit
Admission: RE | Admit: 2021-02-06 | Discharge: 2021-02-06 | Disposition: A | Discharge status: Home / Self Care | Payer: PPO | Source: Ambulatory Visit | Attending: Family Medicine | Admitting: Family Medicine

## 2021-02-06 DIAGNOSIS — R922 Inconclusive mammogram: Secondary | ICD-10-CM | POA: Diagnosis not present

## 2021-02-06 DIAGNOSIS — N6002 Solitary cyst of left breast: Secondary | ICD-10-CM | POA: Diagnosis not present

## 2021-02-06 DIAGNOSIS — N644 Mastodynia: Secondary | ICD-10-CM

## 2021-03-13 ENCOUNTER — Ambulatory Visit: Payer: PPO | Admitting: Neurology

## 2021-03-19 DIAGNOSIS — R2689 Other abnormalities of gait and mobility: Secondary | ICD-10-CM | POA: Diagnosis not present

## 2021-03-19 DIAGNOSIS — R519 Headache, unspecified: Secondary | ICD-10-CM | POA: Diagnosis not present

## 2021-03-19 DIAGNOSIS — F339 Major depressive disorder, recurrent, unspecified: Secondary | ICD-10-CM | POA: Diagnosis not present

## 2021-04-04 ENCOUNTER — Encounter: Payer: Self-pay | Admitting: Neurology

## 2021-05-05 DIAGNOSIS — L720 Epidermal cyst: Secondary | ICD-10-CM | POA: Diagnosis not present
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
## 2021-06-25 ENCOUNTER — Ambulatory Visit: Payer: PPO | Admitting: Neurology

## 2021-06-25 ENCOUNTER — Other Ambulatory Visit: Payer: Self-pay

## 2021-06-25 ENCOUNTER — Encounter: Payer: Self-pay | Admitting: Neurology

## 2021-06-25 VITALS — BP 135/70 | HR 90 | Ht 60.0 in | Wt 140.6 lb

## 2021-06-25 DIAGNOSIS — R42 Dizziness and giddiness: Secondary | ICD-10-CM

## 2021-06-25 DIAGNOSIS — G4485 Primary stabbing headache: Secondary | ICD-10-CM | POA: Diagnosis not present

## 2021-06-25 DIAGNOSIS — R296 Repeated falls: Secondary | ICD-10-CM

## 2021-06-25 NOTE — Patient Instructions (Signed)
Will check MRI of brain with and without contrast and MRA of head and neck  Follow up after testing

## 2021-07-19 ENCOUNTER — Ambulatory Visit
Admission: RE | Admit: 2021-07-19 | Discharge: 2021-07-19 | Disposition: A | Discharge status: Home / Self Care | Payer: PPO | Source: Ambulatory Visit | Attending: Neurology | Admitting: Neurology

## 2021-07-19 ENCOUNTER — Other Ambulatory Visit: Payer: Self-pay

## 2021-07-19 DIAGNOSIS — G4485 Primary stabbing headache: Secondary | ICD-10-CM

## 2021-09-25 ENCOUNTER — Other Ambulatory Visit (HOSPITAL_COMMUNITY)
Admission: RE | Admit: 2021-09-25 | Discharge: 2021-09-25 | Disposition: A | Discharge status: Home / Self Care | Payer: PPO | Source: Ambulatory Visit | Attending: Family Medicine | Admitting: Family Medicine

## 2021-09-25 ENCOUNTER — Other Ambulatory Visit: Payer: Self-pay | Admitting: Family Medicine

## 2021-09-25 DIAGNOSIS — Z1211 Encounter for screening for malignant neoplasm of colon: Secondary | ICD-10-CM | POA: Diagnosis not present

## 2021-09-25 DIAGNOSIS — E559 Vitamin D deficiency, unspecified: Secondary | ICD-10-CM | POA: Diagnosis not present

## 2021-09-25 DIAGNOSIS — F339 Major depressive disorder, recurrent, unspecified: Secondary | ICD-10-CM | POA: Diagnosis not present

## 2021-09-25 DIAGNOSIS — Z0000 Encounter for general adult medical examination without abnormal findings: Secondary | ICD-10-CM | POA: Diagnosis not present

## 2021-09-25 DIAGNOSIS — Z01411 Encounter for gynecological examination (general) (routine) with abnormal findings: Secondary | ICD-10-CM | POA: Diagnosis not present

## 2021-09-25 DIAGNOSIS — L659 Nonscarring hair loss, unspecified: Secondary | ICD-10-CM | POA: Diagnosis not present

## 2021-09-25 DIAGNOSIS — Z1151 Encounter for screening for human papillomavirus (HPV): Secondary | ICD-10-CM | POA: Diagnosis not present

## 2021-09-25 DIAGNOSIS — I340 Nonrheumatic mitral (valve) insufficiency: Secondary | ICD-10-CM | POA: Diagnosis not present

## 2021-09-25 DIAGNOSIS — Z803 Family history of malignant neoplasm of breast: Secondary | ICD-10-CM | POA: Diagnosis not present

## 2021-09-25 DIAGNOSIS — Z79899 Other long term (current) drug therapy: Secondary | ICD-10-CM | POA: Diagnosis not present

## 2021-09-25 DIAGNOSIS — J309 Allergic rhinitis, unspecified: Secondary | ICD-10-CM | POA: Diagnosis not present

## 2021-09-25 DIAGNOSIS — E785 Hyperlipidemia, unspecified: Secondary | ICD-10-CM | POA: Diagnosis not present

## 2021-09-26 LAB — CYTOLOGY - PAP
Comment: NEGATIVE
Diagnosis: NEGATIVE
High risk HPV: NEGATIVE

## 2022-01-27 ENCOUNTER — Ambulatory Visit: Payer: PPO | Admitting: Neurology

## 2022-04-14 ENCOUNTER — Other Ambulatory Visit: Payer: Self-pay | Admitting: Family Medicine

## 2022-04-14 DIAGNOSIS — Z1231 Encounter for screening mammogram for malignant neoplasm of breast: Secondary | ICD-10-CM

## 2022-05-07 ENCOUNTER — Ambulatory Visit: Payer: PPO

## 2022-05-26 ENCOUNTER — Ambulatory Visit
Admission: RE | Admit: 2022-05-26 | Discharge: 2022-05-26 | Disposition: A | Discharge status: Home / Self Care | Payer: PPO | Source: Ambulatory Visit | Attending: Family Medicine | Admitting: Family Medicine

## 2022-05-26 DIAGNOSIS — Z1231 Encounter for screening mammogram for malignant neoplasm of breast: Secondary | ICD-10-CM | POA: Diagnosis not present

## 2022-05-29 DIAGNOSIS — Z1211 Encounter for screening for malignant neoplasm of colon: Secondary | ICD-10-CM | POA: Diagnosis not present

## 2022-10-06 DIAGNOSIS — J309 Allergic rhinitis, unspecified: Secondary | ICD-10-CM | POA: Diagnosis not present

## 2022-10-06 DIAGNOSIS — Z9181 History of falling: Secondary | ICD-10-CM | POA: Diagnosis not present

## 2022-10-06 DIAGNOSIS — Z79899 Other long term (current) drug therapy: Secondary | ICD-10-CM | POA: Diagnosis not present

## 2022-10-06 DIAGNOSIS — I340 Nonrheumatic mitral (valve) insufficiency: Secondary | ICD-10-CM | POA: Diagnosis not present

## 2022-10-06 DIAGNOSIS — Z803 Family history of malignant neoplasm of breast: Secondary | ICD-10-CM | POA: Diagnosis not present

## 2022-10-06 DIAGNOSIS — E785 Hyperlipidemia, unspecified: Secondary | ICD-10-CM | POA: Diagnosis not present

## 2022-10-06 DIAGNOSIS — E559 Vitamin D deficiency, unspecified: Secondary | ICD-10-CM | POA: Diagnosis not present

## 2022-10-06 DIAGNOSIS — F339 Major depressive disorder, recurrent, unspecified: Secondary | ICD-10-CM | POA: Diagnosis not present

## 2022-10-06 DIAGNOSIS — Z0000 Encounter for general adult medical examination without abnormal findings: Secondary | ICD-10-CM | POA: Diagnosis not present

## 2022-12-01 DIAGNOSIS — J209 Acute bronchitis, unspecified: Secondary | ICD-10-CM | POA: Diagnosis not present

## 2022-12-09 ENCOUNTER — Ambulatory Visit
Admission: RE | Admit: 2022-12-09 | Discharge: 2022-12-09 | Disposition: A | Discharge status: Home / Self Care | Payer: PPO | Source: Ambulatory Visit | Attending: Family Medicine | Admitting: Family Medicine

## 2022-12-09 ENCOUNTER — Other Ambulatory Visit: Payer: Self-pay | Admitting: Family Medicine

## 2022-12-09 DIAGNOSIS — J019 Acute sinusitis, unspecified: Secondary | ICD-10-CM | POA: Diagnosis not present

## 2022-12-09 DIAGNOSIS — R053 Chronic cough: Secondary | ICD-10-CM | POA: Diagnosis not present

## 2022-12-09 DIAGNOSIS — R059 Cough, unspecified: Secondary | ICD-10-CM | POA: Diagnosis not present

## 2022-12-18 DIAGNOSIS — F339 Major depressive disorder, recurrent, unspecified: Secondary | ICD-10-CM | POA: Diagnosis not present

## 2022-12-18 DIAGNOSIS — R079 Chest pain, unspecified: Secondary | ICD-10-CM | POA: Diagnosis not present

## 2023-02-04 DIAGNOSIS — R059 Cough, unspecified: Secondary | ICD-10-CM | POA: Diagnosis not present

## 2023-02-04 DIAGNOSIS — T7840XA Allergy, unspecified, initial encounter: Secondary | ICD-10-CM | POA: Diagnosis not present

## 2023-03-05 ENCOUNTER — Encounter: Payer: Self-pay | Admitting: Internal Medicine

## 2023-03-30 ENCOUNTER — Ambulatory Visit (AMBULATORY_SURGERY_CENTER): Payer: PPO | Admitting: *Deleted

## 2023-03-30 VITALS — Ht 60.0 in | Wt 145.0 lb

## 2023-03-30 DIAGNOSIS — Z1211 Encounter for screening for malignant neoplasm of colon: Secondary | ICD-10-CM

## 2023-03-30 MED ORDER — PEG 3350-KCL-NA BICARB-NACL 420 G PO SOLR: 4000.0000 mL | Freq: Once | ORAL | 0 refills | Status: AC

## 2023-03-30 NOTE — Progress Notes (Signed)
Pt's name and DOB verified at the beginning of the pre-visit.  Pt denies any difficulty with ambulating,sitting, laying down or rolling side to side Gave both [facility] main # and MD on call # prior to instructions.  No egg or soy allergy known to patient  No issues known to pt with past sedation with any surgeries or procedures Pt denies having issues being intubated Pt has no issues moving head neck or swallowing No FH of Malignant Hyperthermia Pt is not on diet pills Pt is not on home 02  Pt is not on blood thinners  Pt denies issues with constipation  Pt is not on dialysis Pt denise any abnormal heart rhythms  Pt denies any upcoming cardiac testing Pt encouraged to use to use Singlecare or Goodrx to reduce cost  Patient's chart reviewed by Cathlyn Parsons CNRA prior to pre-visit and patient appropriate for the [facility].  Pre-visit completed and red dot placed by patient's name on their procedure day (on provider's schedule).  . Visit by phone Pt states weight is 145 lb Instructed pt why it is important to and  to call if they have any changes in health or new medications. Directed them to the # given and on instructions.   Pt states they will.  Instructions reviewed with pt and pt states understanding. Instructed to review again prior to procedure. Pt states they will.  Instructions sent by mail with coupon and by my chart

## 2023-04-06 ENCOUNTER — Encounter: Payer: Self-pay | Admitting: Internal Medicine

## 2023-04-18 ENCOUNTER — Encounter: Payer: Self-pay | Admitting: Certified Registered Nurse Anesthetist

## 2023-04-21 ENCOUNTER — Ambulatory Visit (AMBULATORY_SURGERY_CENTER): Payer: PPO | Admitting: Internal Medicine

## 2023-04-21 ENCOUNTER — Encounter: Payer: Self-pay | Admitting: Internal Medicine

## 2023-04-21 VITALS — BP 140/83 | HR 135 | Temp 98.0°F | Resp 13 | Ht 60.0 in | Wt 145.0 lb

## 2023-04-21 DIAGNOSIS — Z1211 Encounter for screening for malignant neoplasm of colon: Secondary | ICD-10-CM

## 2023-04-21 DIAGNOSIS — D124 Benign neoplasm of descending colon: Secondary | ICD-10-CM

## 2023-04-21 DIAGNOSIS — K635 Polyp of colon: Secondary | ICD-10-CM | POA: Diagnosis not present

## 2023-04-21 DIAGNOSIS — E7800 Pure hypercholesterolemia, unspecified: Secondary | ICD-10-CM | POA: Diagnosis not present

## 2023-04-21 DIAGNOSIS — F32A Depression, unspecified: Secondary | ICD-10-CM | POA: Diagnosis not present

## 2023-04-21 MED ORDER — SODIUM CHLORIDE 0.9 % IV SOLN: 500.0000 mL | INTRAVENOUS | Status: DC

## 2023-04-21 NOTE — Op Note (Signed)
[facility] Patient Name: Anita Hawkins Procedure Date: 04/21/2023 11:11 AM MRN: [id number] Endoscopist: Iva Boop , MD, [id number] Age: 62 Referring MD:  Date of Birth: 26-Oct-1960 Gender: Female Account #: [id number] Procedure:                Colonoscopy Indications:              Screening for colorectal malignant neoplasm Medicines:                Monitored Anesthesia Care Procedure:                Pre-Anesthesia Assessment:                           - Prior to the procedure, a History and Physical                            was performed, and patient medications and                            allergies were reviewed. The patient's tolerance of                            previous anesthesia was also reviewed. The risks                            and benefits of the procedure and the sedation                            options and risks were discussed with the patient.                            All questions were answered, and informed consent                            was obtained. Prior Anticoagulants: The patient has                            taken no anticoagulant or antiplatelet agents. ASA                            Grade Assessment: I - A normal, healthy patient.                            After reviewing the risks and benefits, the patient                            was deemed in satisfactory condition to undergo the                            procedure.                           After obtaining informed consent, the colonoscope  was passed under direct vision. Throughout the                            procedure, the patient's blood pressure, pulse, and                            oxygen saturations were monitored continuously. The                            [id number]-HQ190L Colonoscope [id number] was introduced                            through the anus and advanced to the the cecum,                            identified by appendiceal  orifice and ileocecal                            valve. The colonoscopy was performed without                            difficulty. The patient tolerated the procedure                            well. The quality of the bowel preparation was                            good. The ileocecal valve, appendiceal orifice, and                            rectum were photographed. Scope In: 11:17:07 AM Scope Out: 11:32:09 AM Scope Withdrawal Time: 0 hours 12 minutes 21 seconds  Total Procedure Duration: 0 hours 15 minutes 2 seconds  Findings:                 The perianal and digital rectal examinations were                            normal.                           Two flat and sessile polyps were found in the                            descending colon. The polyps were 4 to 7 mm in                            size. These polyps were removed with a cold snare.                            Resection and retrieval were complete. Verification                            of patient identification for the specimen was  done. Estimated blood loss was minimal.                           The exam was otherwise without abnormality on                            direct and retroflexion views. Complications:            No immediate complications. Estimated Blood Loss:     Estimated blood loss was minimal. Impression:               - Two 4 to 7 mm polyps in the descending colon,                            removed with a cold snare. Resected and retrieved.                           - The examination was otherwise normal on direct                            and retroflexion views. Recommendation:           - Patient has a contact number available for                            emergencies. The signs and symptoms of potential                            delayed complications were discussed with the                            patient. Return to normal activities tomorrow.                             Written discharge instructions were provided to the                            patient.                           - Resume previous diet.                           - Continue present medications.                           - Repeat colonoscopy is recommended. The                            colonoscopy date will be determined after pathology                            results from today's exam become available for                            review. Iva Boop, MD 04/21/2023 11:40:09 AM This  report has been signed electronically.

## 2023-04-21 NOTE — Progress Notes (Signed)
[facility] History and Physical   Primary Care Physician:  Mila Palmer, MD   Reason for Procedure:    Encounter Diagnosis  Name Primary?   Special screening for malignant neoplasms, colon Yes     Plan:    colonoscopy     HPI: Anita Hawkins is a 62 y.o. female    Past Medical History:  Diagnosis Date   Arthritis    Depression    History of miscarriage    Hyperlipidemia     Past Surgical History:  Procedure Laterality Date   BILATERAL OOPHORECTOMY     CHOLECYSTECTOMY     COLONOSCOPY      Prior to Admission medications   Medication Sig Start Date End Date Taking? Authorizing Provider  Beta Carotene (VITAMIN A) 25000 UNIT capsule Take 25,000 Units by mouth. 1 x week   Yes [provider]  cholecalciferol (VITAMIN D3) 25 MCG (1000 UNIT) tablet Take 1,000 Units by mouth daily.   Yes [provider]  vitamin B-12 (CYANOCOBALAMIN) 500 MCG tablet Take 500 mcg by mouth daily.   Yes [provider]  albuterol (VENTOLIN HFA) 108 (90 Base) MCG/ACT inhaler PLEASE SEE ATTACHED FOR DETAILED DIRECTIONS 12/01/22   [provider]  traMADol (ULTRAM) 50 MG tablet Take by mouth. 04/11/19   [provider]    Current Outpatient Medications  Medication Sig Dispense Refill   Beta Carotene (VITAMIN A) 25000 UNIT capsule Take 25,000 Units by mouth. 1 x week     cholecalciferol (VITAMIN D3) 25 MCG (1000 UNIT) tablet Take 1,000 Units by mouth daily.     vitamin B-12 (CYANOCOBALAMIN) 500 MCG tablet Take 500 mcg by mouth daily.     albuterol (VENTOLIN HFA) 108 (90 Base) MCG/ACT inhaler PLEASE SEE ATTACHED FOR DETAILED DIRECTIONS     traMADol (ULTRAM) 50 MG tablet Take by mouth.     Current Facility-Administered Medications  Medication Dose Route Frequency Provider Last Rate Last Admin   0.9 %  sodium chloride infusion  500 mL Intravenous Continuous Iva Boop, MD        Allergies as of 04/21/2023 - Review Complete  04/21/2023  Allergen Reaction Noted   Amoxicillin     Duloxetine Other (See Comments) 11/08/2018   Gabapentin Other (See Comments) 04/19/2019   Penicillins     Sulfonamide derivatives     Fluoxetine Rash 10/18/2018   Prednisone Rash 10/18/2018    Family History  Problem Relation Age of Onset   Stroke Mother    Diabetes Mother    Dementia Father    Heart attack Maternal Grandfather    Dementia Paternal Grandfather    Colon polyps Neg Hx    Colon cancer Neg Hx    Esophageal cancer Neg Hx    Rectal cancer Neg Hx    Stomach cancer Neg Hx     Social History   Socioeconomic History   Marital status: Married    Spouse name: Not on file   Number of children: Not on file   Years of education: Not on file   Highest education level: Not on file  Occupational History   Not on file  Tobacco Use   Smoking status: Never   Smokeless tobacco: Never  Substance and Sexual Activity   Alcohol use: Never   Drug use: Never   Sexual activity: Not on file  Other Topics Concern   Not on file  Social History Narrative   Not on file   Social Determinants of Health  Financial Resource Strain: Not on file  Food Insecurity: Not on file  Transportation Needs: Not on file  Physical Activity: Not on file  Stress: Not on file  Social Connections: Not on file  Intimate Partner Violence: Not on file    Review of Systems:  All other review of systems negative except as mentioned in the HPI.  Physical Exam: Vital signs BP 109/62   Pulse 75   Temp 98 F (36.7 C) (Temporal)   Ht 5' (1.524 m)   Wt 145 lb (65.8 kg)   SpO2 100%   BMI 28.32 kg/m   General:   Alert,  Well-developed, well-nourished, pleasant and cooperative in NAD Lungs:  Clear throughout to auscultation.   Heart:  Regular rate and rhythm; no murmurs, clicks, rubs,  or gallops. Abdomen:  Soft, nontender and nondistended. Normal bowel sounds.   Neuro/Psych:  Alert and cooperative. Normal mood and affect. A and O x  3   @Darin Arndt  Sena Slate, MD, [facility] [phone] (pager) 04/21/2023 11:01 AM@

## 2023-04-21 NOTE — Progress Notes (Signed)
Report given to PACU, vss 

## 2023-04-21 NOTE — Patient Instructions (Addendum)
I found and removed 2 tiny polyps that look benign.  All else ok.  I will let you know pathology results and when to have another routine colonoscopy by mail and/or My Chart.  I appreciate the opportunity to care for you. Iva Boop, MD, [facility]  Recommendation:           - Patient has a contact number available for                            emergencies. The signs and symptoms of potential                            delayed complications were discussed with the                            patient. Return to normal activities tomorrow.                            Written discharge instructions were provided to the                            patient.                           - Resume previous diet.                           - Continue present medications.                           - Repeat colonoscopy is recommended. The                            colonoscopy date will be determined after pathology                            results from today's exam become available for                            review.     YOU HAD AN ENDOSCOPIC PROCEDURE TODAY AT THE [facility]:   Refer to the procedure report that was given to you for any specific questions about what was found during the examination.  If the procedure report does not answer your questions, please call your gastroenterologist to clarify.  If you requested that your care partner not be given the details of your procedure findings, then the procedure report has been included in a sealed envelope for you to review at your convenience later.  YOU SHOULD EXPECT: Some feelings of bloating in the abdomen. Passage of more gas than usual.  Walking can help get rid of the air that was put into your GI tract during the procedure and reduce the bloating. If you had a lower endoscopy (such as a colonoscopy or flexible sigmoidoscopy) you may notice spotting of blood in your stool or on the toilet paper. If you underwent a bowel prep  for your procedure, you may not have a normal bowel movement for a few days.  Please Note:  You might notice some irritation and congestion in your nose or some drainage.  This is from the oxygen used during your procedure.  There is no need for concern and it should clear up in a day or so.  SYMPTOMS TO REPORT IMMEDIATELY:  Following lower endoscopy (colonoscopy or flexible sigmoidoscopy):  Excessive amounts of blood in the stool  Significant tenderness or worsening of abdominal pains  Swelling of the abdomen that is new, acute  Fever of 100F or higher   For urgent or emergent issues, a gastroenterologist can be reached at any hour by calling [phone]. Do not use MyChart messaging for urgent concerns.    DIET:  We do recommend a small meal at first, but then you may proceed to your regular diet.  Drink plenty of fluids but you should avoid alcoholic beverages for 24 hours.  ACTIVITY:  You should plan to take it easy for the rest of today and you should NOT DRIVE or use heavy machinery until tomorrow (because of the sedation medicines used during the test).    FOLLOW UP: Our staff will call the number listed on your records the next business day following your procedure.  We will call around 7:15- 8:00 am to check on you and address any questions or concerns that you may have regarding the information given to you following your procedure. If we do not reach you, we will leave a message.     If any biopsies were taken you will be contacted by phone or by letter within the next 1-3 weeks.  Please call us at [phone] if you have not heard about the biopsies in 3 weeks.    SIGNATURES/CONFIDENTIALITY: You and/or your care partner have signed paperwork which will be entered into your electronic medical record.  These signatures attest to the fact that that the information above on your After Visit Summary has been reviewed and is understood.  Full responsibility of the  confidentiality of this discharge information lies with you and/or your care-partner.

## 2023-04-21 NOTE — Progress Notes (Signed)
Called to room to assist during endoscopic procedure.  Patient ID and intended procedure confirmed with present staff. Received instructions for my participation in the procedure from the performing physician.  

## 2023-04-22 ENCOUNTER — Telehealth: Payer: Self-pay | Admitting: *Deleted

## 2023-04-22 NOTE — Telephone Encounter (Signed)
  Follow up Call-     04/21/2023   10:37 AM  Call back number  Post procedure Call Back phone  # [phone]  Permission to leave phone message Yes     Patient questions:  Do you have a fever, pain , or abdominal swelling? No. Pain Score  0 *  Have you tolerated food without any problems? Yes.    Have you been able to return to your normal activities? Yes.    Do you have any questions about your discharge instructions: Diet   No. Medications  No. Follow up visit  No.  Do you have questions or concerns about your Care? No.  Actions: * If pain score is 4 or above: No action needed, pain <4.  Pt is c/o "air" still needing to pass.  Encouraged to drink warm fluids, ambulate, use Gas-x as needed.  Told to call back if needed

## 2023-04-27 ENCOUNTER — Encounter: Payer: Self-pay | Admitting: Internal Medicine

## 2023-04-27 DIAGNOSIS — Z86010 Personal history of colonic polyps: Secondary | ICD-10-CM | POA: Insufficient documentation

## 2023-04-27 DIAGNOSIS — Z860101 Personal history of adenomatous and serrated colon polyps: Secondary | ICD-10-CM

## 2023-04-27 HISTORY — DX: Personal history of adenomatous and serrated colon polyps: Z86.0101

## 2023-07-09 ENCOUNTER — Encounter: Payer: Self-pay | Admitting: Family Medicine

## 2023-07-09 ENCOUNTER — Other Ambulatory Visit: Payer: Self-pay | Admitting: Family Medicine

## 2023-07-09 DIAGNOSIS — Z1231 Encounter for screening mammogram for malignant neoplasm of breast: Secondary | ICD-10-CM

## 2023-08-13 ENCOUNTER — Ambulatory Visit
Admission: RE | Admit: 2023-08-13 | Discharge: 2023-08-13 | Disposition: A | Discharge status: Home / Self Care | Payer: PPO | Source: Ambulatory Visit | Attending: Family Medicine | Admitting: Family Medicine

## 2023-08-13 DIAGNOSIS — Z1231 Encounter for screening mammogram for malignant neoplasm of breast: Secondary | ICD-10-CM | POA: Diagnosis not present

## 2023-11-30 DIAGNOSIS — F330 Major depressive disorder, recurrent, mild: Secondary | ICD-10-CM | POA: Diagnosis not present

## 2023-11-30 DIAGNOSIS — E559 Vitamin D deficiency, unspecified: Secondary | ICD-10-CM | POA: Diagnosis not present

## 2023-11-30 DIAGNOSIS — E785 Hyperlipidemia, unspecified: Secondary | ICD-10-CM | POA: Diagnosis not present

## 2023-11-30 DIAGNOSIS — Z79899 Other long term (current) drug therapy: Secondary | ICD-10-CM | POA: Diagnosis not present

## 2023-11-30 DIAGNOSIS — Z803 Family history of malignant neoplasm of breast: Secondary | ICD-10-CM | POA: Diagnosis not present

## 2023-11-30 DIAGNOSIS — Z0000 Encounter for general adult medical examination without abnormal findings: Secondary | ICD-10-CM | POA: Diagnosis not present

## 2024-01-13 DIAGNOSIS — F331 Major depressive disorder, recurrent, moderate: Secondary | ICD-10-CM | POA: Diagnosis not present

## 2024-01-13 DIAGNOSIS — D72829 Elevated white blood cell count, unspecified: Secondary | ICD-10-CM | POA: Diagnosis not present

## 2024-01-13 DIAGNOSIS — R700 Elevated erythrocyte sedimentation rate: Secondary | ICD-10-CM | POA: Diagnosis not present

## 2024-01-13 DIAGNOSIS — R202 Paresthesia of skin: Secondary | ICD-10-CM | POA: Diagnosis not present

## 2024-01-21 ENCOUNTER — Other Ambulatory Visit: Payer: Self-pay | Admitting: Family Medicine

## 2024-01-21 ENCOUNTER — Ambulatory Visit
Admission: RE | Admit: 2024-01-21 | Discharge: 2024-01-21 | Disposition: A | Discharge status: Home / Self Care | Source: Ambulatory Visit | Attending: Family Medicine | Admitting: Family Medicine

## 2024-01-21 DIAGNOSIS — R52 Pain, unspecified: Secondary | ICD-10-CM

## 2024-01-21 DIAGNOSIS — M4316 Spondylolisthesis, lumbar region: Secondary | ICD-10-CM | POA: Diagnosis not present

## 2024-03-07 DIAGNOSIS — D72829 Elevated white blood cell count, unspecified: Secondary | ICD-10-CM | POA: Diagnosis not present

## 2024-06-15 DIAGNOSIS — F418 Other specified anxiety disorders: Secondary | ICD-10-CM | POA: Diagnosis not present

## 2024-06-30 DIAGNOSIS — F418 Other specified anxiety disorders: Secondary | ICD-10-CM | POA: Diagnosis not present
